# Patient Record
Sex: Female | Born: 1980 | Hispanic: No | Marital: Single | Smoking: Never smoker
Health system: Southern US, Community
[De-identification: ages and names within clinical notes are randomized; demographics above are authoritative.]

## PROBLEM LIST (undated history)

## (undated) DIAGNOSIS — Z8041 Family history of malignant neoplasm of ovary: Secondary | ICD-10-CM

## (undated) DIAGNOSIS — Z808 Family history of malignant neoplasm of other organs or systems: Secondary | ICD-10-CM

## (undated) HISTORY — DX: Family history of malignant neoplasm of ovary: Z80.41

## (undated) HISTORY — DX: Family history of malignant neoplasm of other organs or systems: Z80.8

---

## 2014-10-14 ENCOUNTER — Other Ambulatory Visit: Payer: Self-pay | Admitting: Internal Medicine

## 2014-10-14 DIAGNOSIS — D1803 Hemangioma of intra-abdominal structures: Secondary | ICD-10-CM

## 2014-10-27 ENCOUNTER — Ambulatory Visit
Admission: RE | Admit: 2014-10-27 | Discharge: 2014-10-27 | Disposition: A | Discharge status: Home / Self Care | Payer: Self-pay | Source: Ambulatory Visit | Attending: Internal Medicine | Admitting: Internal Medicine

## 2014-10-27 ENCOUNTER — Encounter (INDEPENDENT_AMBULATORY_CARE_PROVIDER_SITE_OTHER): Payer: Self-pay

## 2014-10-27 DIAGNOSIS — D1803 Hemangioma of intra-abdominal structures: Secondary | ICD-10-CM

## 2015-09-22 DIAGNOSIS — H5212 Myopia, left eye: Secondary | ICD-10-CM | POA: Diagnosis not present

## 2015-11-03 DIAGNOSIS — Z1389 Encounter for screening for other disorder: Secondary | ICD-10-CM | POA: Diagnosis not present

## 2015-11-03 DIAGNOSIS — D1803 Hemangioma of intra-abdominal structures: Secondary | ICD-10-CM | POA: Diagnosis not present

## 2015-11-03 DIAGNOSIS — Z0000 Encounter for general adult medical examination without abnormal findings: Secondary | ICD-10-CM | POA: Diagnosis not present

## 2015-11-04 ENCOUNTER — Other Ambulatory Visit: Payer: Self-pay | Admitting: Internal Medicine

## 2015-11-04 DIAGNOSIS — D1803 Hemangioma of intra-abdominal structures: Secondary | ICD-10-CM

## 2015-11-10 DIAGNOSIS — Z0000 Encounter for general adult medical examination without abnormal findings: Secondary | ICD-10-CM | POA: Diagnosis not present

## 2015-11-12 DIAGNOSIS — H109 Unspecified conjunctivitis: Secondary | ICD-10-CM | POA: Diagnosis not present

## 2015-11-23 DIAGNOSIS — H1045 Other chronic allergic conjunctivitis: Secondary | ICD-10-CM | POA: Diagnosis not present

## 2016-04-19 DIAGNOSIS — Z01419 Encounter for gynecological examination (general) (routine) without abnormal findings: Secondary | ICD-10-CM | POA: Diagnosis not present

## 2016-04-27 ENCOUNTER — Ambulatory Visit (INDEPENDENT_AMBULATORY_CARE_PROVIDER_SITE_OTHER): Payer: BLUE CROSS/BLUE SHIELD | Admitting: Internal Medicine

## 2016-04-27 ENCOUNTER — Encounter: Payer: Self-pay | Admitting: Internal Medicine

## 2016-04-27 DIAGNOSIS — Z579 Occupational exposure to unspecified risk factor: Secondary | ICD-10-CM

## 2016-04-27 DIAGNOSIS — Z23 Encounter for immunization: Secondary | ICD-10-CM | POA: Diagnosis not present

## 2016-04-27 NOTE — Progress Notes (Signed)
    RFV: low rabies titer  Patient ID: Desiree Duncan, female   DOB: [birth year], 35 y.o.   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 She reports that she has received 5 dose post exposure rabies vaccine series in 2013. She tolerated the vaccine series without difficulty. She recently had her rabies titers checked and they were low. She has been looking for a provider to be able to give her a rabies vaccine  She is in good health. No recent illnesses or hospitalization, she is not currently pregnant  All: NKMA  Meds: mvi  Past medical history: none, history mild appendicitis  Past surgical history: none  Family history: Maternal GF - bone cancer, M and F have HTN,  Social hx: no smoking, no drinking.  There are no active problems to display for this patient.  Health Maintenance Due  Topic Date Due  . HIV Screening  11/18/1993  . TETANUS/TDAP  11/18/1997  . PAP SMEAR  11/19/1999  . INFLUENZA VACCINE  12/13/2015    Physical exam:  Gen: axo by 3 in NAD HEENT = [place]/AT, PERRLA, EOMI, Skin = no signs of rash Neuro = CN2-12 intact, motor intact  Assessment and Plan  Low rabies titer, need for rabies vaccine due to occupation = per CDC/ACIP recommendation is that the patient receives 1 dose as a booster of rabies vaccine. I will recommend that she can come back in 4 wk to check post vaccine titer, then should repeat every 2 years to determine if she needs further boosters.

## 2016-04-27 NOTE — Patient Instructions (Addendum)
Please come back in 4 wk to get repeat blood work to check your rabies titer.  [id number]

## 2016-05-24 ENCOUNTER — Other Ambulatory Visit: Payer: BLUE CROSS/BLUE SHIELD

## 2016-05-31 ENCOUNTER — Other Ambulatory Visit: Payer: BLUE CROSS/BLUE SHIELD

## 2016-06-22 DIAGNOSIS — B349 Viral infection, unspecified: Secondary | ICD-10-CM | POA: Diagnosis not present

## 2016-07-05 ENCOUNTER — Other Ambulatory Visit (HOSPITAL_COMMUNITY)
Admission: RE | Admit: 2016-07-05 | Discharge: 2016-07-05 | Disposition: A | Discharge status: Home / Self Care | Payer: BLUE CROSS/BLUE SHIELD | Source: Ambulatory Visit | Attending: Obstetrics and Gynecology | Admitting: Obstetrics and Gynecology

## 2016-07-05 DIAGNOSIS — Z01419 Encounter for gynecological examination (general) (routine) without abnormal findings: Secondary | ICD-10-CM | POA: Diagnosis not present

## 2016-07-05 DIAGNOSIS — Z1151 Encounter for screening for human papillomavirus (HPV): Secondary | ICD-10-CM | POA: Insufficient documentation

## 2016-07-05 DIAGNOSIS — Z124 Encounter for screening for malignant neoplasm of cervix: Secondary | ICD-10-CM | POA: Diagnosis not present

## 2016-08-09 DIAGNOSIS — D259 Leiomyoma of uterus, unspecified: Secondary | ICD-10-CM | POA: Diagnosis not present

## 2016-11-08 DIAGNOSIS — D1803 Hemangioma of intra-abdominal structures: Secondary | ICD-10-CM | POA: Diagnosis not present

## 2016-11-08 DIAGNOSIS — Z0000 Encounter for general adult medical examination without abnormal findings: Secondary | ICD-10-CM | POA: Diagnosis not present

## 2017-03-12 DIAGNOSIS — D251 Intramural leiomyoma of uterus: Secondary | ICD-10-CM | POA: Diagnosis not present

## 2017-04-11 DIAGNOSIS — F4323 Adjustment disorder with mixed anxiety and depressed mood: Secondary | ICD-10-CM | POA: Diagnosis not present

## 2017-07-05 DIAGNOSIS — Z23 Encounter for immunization: Secondary | ICD-10-CM | POA: Diagnosis not present

## 2017-08-05 DIAGNOSIS — N200 Calculus of kidney: Secondary | ICD-10-CM | POA: Diagnosis not present

## 2017-08-27 DIAGNOSIS — Z01419 Encounter for gynecological examination (general) (routine) without abnormal findings: Secondary | ICD-10-CM | POA: Diagnosis not present

## 2017-09-05 DIAGNOSIS — N200 Calculus of kidney: Secondary | ICD-10-CM | POA: Diagnosis not present

## 2017-11-12 DIAGNOSIS — D1803 Hemangioma of intra-abdominal structures: Secondary | ICD-10-CM | POA: Diagnosis not present

## 2017-11-12 DIAGNOSIS — N200 Calculus of kidney: Secondary | ICD-10-CM | POA: Diagnosis not present

## 2017-11-12 DIAGNOSIS — Z0000 Encounter for general adult medical examination without abnormal findings: Secondary | ICD-10-CM | POA: Diagnosis not present

## 2017-11-12 DIAGNOSIS — Z1389 Encounter for screening for other disorder: Secondary | ICD-10-CM | POA: Diagnosis not present

## 2017-12-03 DIAGNOSIS — D219 Benign neoplasm of connective and other soft tissue, unspecified: Secondary | ICD-10-CM | POA: Diagnosis not present

## 2017-12-03 DIAGNOSIS — Z8041 Family history of malignant neoplasm of ovary: Secondary | ICD-10-CM | POA: Diagnosis not present

## 2017-12-24 ENCOUNTER — Encounter: Payer: Self-pay | Admitting: Genetics

## 2017-12-24 ENCOUNTER — Telehealth: Payer: Self-pay | Admitting: Genetics

## 2017-12-24 NOTE — Telephone Encounter (Signed)
New genetic counseling referral received from Dr. Christophe Louis from [facility] for fhx of ovarian cancer in first degree relative. Pt has been scheduled to see Ferol Luz on 9/10 at 11am. Letter mailed to the pt.

## 2018-01-02 DIAGNOSIS — R600 Localized edema: Secondary | ICD-10-CM | POA: Diagnosis not present

## 2018-01-02 DIAGNOSIS — R0683 Snoring: Secondary | ICD-10-CM | POA: Diagnosis not present

## 2018-01-21 ENCOUNTER — Inpatient Hospital Stay: Payer: BLUE CROSS/BLUE SHIELD

## 2018-01-21 ENCOUNTER — Telehealth: Payer: Self-pay | Admitting: Genetics

## 2018-01-21 ENCOUNTER — Inpatient Hospital Stay: Payer: BLUE CROSS/BLUE SHIELD | Attending: Genetic Counselor | Admitting: Genetics

## 2018-01-21 ENCOUNTER — Encounter: Payer: Self-pay | Admitting: Genetics

## 2018-01-21 NOTE — Telephone Encounter (Signed)
Left messate- told patient we were calling to touch base about missed appointment for genetic counseling on 01/21/2018.  Left my number and New patient referral/scheduling number if she would like to reschedule.

## 2018-02-13 ENCOUNTER — Inpatient Hospital Stay: Payer: BLUE CROSS/BLUE SHIELD | Admitting: Licensed Clinical Social Worker

## 2018-02-13 ENCOUNTER — Inpatient Hospital Stay: Payer: BLUE CROSS/BLUE SHIELD

## 2018-02-25 ENCOUNTER — Other Ambulatory Visit: Payer: Self-pay | Admitting: Geriatric Medicine

## 2018-02-25 DIAGNOSIS — R1084 Generalized abdominal pain: Secondary | ICD-10-CM

## 2018-02-25 DIAGNOSIS — R1031 Right lower quadrant pain: Secondary | ICD-10-CM | POA: Diagnosis not present

## 2018-03-10 ENCOUNTER — Other Ambulatory Visit: Payer: BLUE CROSS/BLUE SHIELD

## 2018-03-10 ENCOUNTER — Encounter: Payer: Self-pay | Admitting: Licensed Clinical Social Worker

## 2018-03-10 ENCOUNTER — Inpatient Hospital Stay: Payer: BLUE CROSS/BLUE SHIELD

## 2018-03-10 ENCOUNTER — Inpatient Hospital Stay: Payer: BLUE CROSS/BLUE SHIELD | Attending: Genetic Counselor | Admitting: Licensed Clinical Social Worker

## 2018-03-10 DIAGNOSIS — Z808 Family history of malignant neoplasm of other organs or systems: Secondary | ICD-10-CM

## 2018-03-10 DIAGNOSIS — Z8041 Family history of malignant neoplasm of ovary: Secondary | ICD-10-CM

## 2018-03-10 NOTE — Progress Notes (Signed)
REFERRING PROVIDER: Christophe Louis, MD [place] [address] [employer] [address] [place], [place] [address]  PRIMARY PROVIDER:  Wenda Low, MD  PRIMARY REASON FOR VISIT:  1. Family history of ovarian cancer   2. Family history of bone cancer      HISTORY OF PRESENT ILLNESS:   Desiree Duncan, a 37 y.o. female, was seen for a [facility] cancer genetics consultation at the request of Dr. Landry Mellow due to a family history of ovarian cancer.  Desiree Duncan presents to clinic today to discuss the possibility of a hereditary predisposition to cancer, genetic testing, and to further clarify her future cancer risks, as well as potential cancer risks for family members.    Desiree Duncan is a 37 y.o. female with no personal history of cancer.    HORMONAL RISK FACTORS:  Menarche was at age 54.  First live birth at age: no children.  OCP use for approximately 0 years.  Ovaries intact: yes.  Hysterectomy: no.  Menopausal status: premenopausal.  HRT use: 0 years. Colonoscopy: no; not examined. Mammogram within the last year: yes. Number of breast biopsies: 0.  Past Medical History:  Diagnosis Date  . Family history of bone cancer   . Family history of ovarian cancer     Social History   Socioeconomic History  . Marital status: Single    Spouse name: Not on file  . Number of children: Not on file  . Years of education: Not on file  . Highest education level: Not on file  Occupational History  . Not on file  Social Needs  . Financial resource strain: Not on file  . Food insecurity:    Worry: Not on file    Inability: Not on file  . Transportation needs:    Medical: Not on file    Non-medical: Not on file  Tobacco Use  . Smoking status: Never Smoker  . Smokeless tobacco: Never Used  Substance and Sexual Activity  . Alcohol use: No  . Drug use: No  . Sexual activity: Not on file  Lifestyle  . Physical activity:    Days per week: Not on file    Minutes per session: Not on file  . Stress:  Not on file  Relationships  . Social connections:    Talks on phone: Not on file    Gets together: Not on file    Attends religious service: Not on file    Active member of club or organization: Not on file    Attends meetings of clubs or organizations: Not on file    Relationship status: Not on file  Other Topics Concern  . Not on file  Social History Narrative  . Not on file     FAMILY HISTORY:  We obtained a detailed, 4-generation family history.  Significant diagnoses are listed below: Family History  Problem Relation Age of Onset  . Ovarian cancer Sister 62       Negative genetic testing, [facility] Common Hereditary Cancers Panel  . Bone cancer Maternal Grandfather 49    Desiree Duncan does not have children. She has one sister, 17, who was recently diagnosed with ovarian cancer. She reports that she had negative genetic testing through [facility] and showed me a picture of the test report on her phone--she tested negative on the Common Hereditary Cancers Panel. Desiree Duncan has one brother as well, he is 80 with no cancer history.   Desiree Duncan's father is living at 8, no cancers. He has two brothers  and two sisters. One of the patient's paternal uncles died in an accident, the rest of her aunts and uncles are living and doing well as are her paternal cousins. Her paternal grandmother is 21 and grandfather is 14.  Desiree Duncan's mother is living at 18. She has one brother and sister, they are doing well as are Desiree Duncan's maternal cousins. The patient's maternal grandfather had bone cancer diagnosed at 52 and he died at 4. Her maternal grandmother is living at 80.   Desiree Duncan is aware of previous family history of genetic testing for hereditary cancer risks. Patient's maternal ancestors are of [place] descent, and paternal ancestors are of [place] descent. There is no reported Ashkenazi Jewish ancestry. There is no known consanguinity.  GENETIC COUNSELING ASSESSMENT:  Desiree Duncan is a 37 y.o. female with a family history which is somewhat suggestive of a Hereditary Cancer Predisposition Syndrome. We, therefore, discussed and recommended the following at today's visit.   DISCUSSION: We discussed that about 5-10% ofcancer cases are hereditary. We discussed the BRCA1 and BRCA2 genes in particular, noting that there are many other genes we can look at that are associated with many types of cancers. We reviewed the characteristics, features and inheritance patterns of hereditary cancer syndromes. We also discussed genetic testing, including the appropriate family members to test, the process of testing, insurance coverage and turn-around-time for results. We discussed the implications of a negative, positive and/or variant of uncertain significant result. We discussed that Desiree Duncan sister was the most appropriate person to test and that, given her negative testing, it is very unlikely that we would find something in Desiree Duncan. While her sister's negative genetic testing is very reassuring, Desiree Duncan still wanted to pursue genetic testing for her own peace of mind. We recommended Desiree Duncan pursue genetic testing for [facility]'s Multi-Cancer Panel.  The Multi-Cancer Panel offered by [facility] includes sequencing and/or deletion duplication testing of the following 84 genes: AIP, ALK, APC, ATM, AXIN2,BAP1,  BARD1, BLM, BMPR1A, BRCA1, BRCA2, BRIP1, CASR, CDC73, CDH1, CDK4, CDKN1B, CDKN1C, CDKN2A (p14ARF), CDKN2A (p16INK4a), CEBPA, CHEK2, CTNNA1, DICER1, DIS3L2, EGFR (c.2369C>T, p.Thr790Met variant only), EPCAM (Deletion/duplication testing only), FH, FLCN, GATA2, GPC3, GREM1 (Promoter region deletion/duplication testing only), HOXB13 (c.251G>A, p.Gly84Glu), HRAS, KIT, MAX, MEN1, MET, MITF (c.952G>A, p.Glu318Lys variant only), MLH1, MSH2, MSH3, MSH6, MUTYH, NBN, NF1, NF2, NTHL1, PALB2, PDGFRA, PHOX2B, PMS2, POLD1, POLE, POT1, PRKAR1A, PTCH1, PTEN, RAD50, RAD51C,  RAD51D, RB1, RECQL4, RET, RUNX1, SDHAF2, SDHA (sequence changes only), SDHB, SDHC, SDHD, SMAD4, SMARCA4, SMARCB1, SMARCE1, STK11, SUFU, TERC, TERT, TMEM127, TP53, TSC1, TSC2, VHL, WRN and WT1.   We discussed that if she is found to have a mutation in one of these genes, it may impact future medical management recommendations such as increased cancer screenings and consideration of risk reducing surgeries.  A positive result could also have implications for the patient's family members.  A Negative result would mean we were unable to identify a hereditary component to her family history of cancer but does not rule out the possibility of a hereditary basis for her family history of cancer.  There could be mutations that are undetectable by current technology, or in genes not yet tested or identified to increase cancer risk.    We discussed the potential to find a Variant of Uncertain Significance or VUS.  These are variants that have not yet been identified as pathogenic or benign, and it is unknown if this variant is associated with increased cancer risk or if this  is a normal finding.  Most VUS's are reclassified to benign or likely benign.   It should not be used to make medical management decisions. With time, we suspect the lab will determine the significance of any VUS's identified if any.   Based on Desiree Duncan's family history of cancer, she meets NCCN medical criteria for genetic testing. Despite that she meets criteria, she may still have an out of pocket cost. The lab will provider her with an out of pocket cost as well as a patient pay option.   PLAN: After considering the risks, benefits, and limitations, Desiree Duncan  provided informed consent to pursue genetic testing and the blood sample was sent to [facility] for analysis of the Multi-Cancer Panel. Results should be available within approximately 2-3 weeks' time, at which point they will be disclosed by telephone to Ms.  Duncan, as will any additional recommendations warranted by these results. Desiree Duncan will receive a summary of her genetic counseling visit and a copy of her results once available. This information will also be available in Epic.  Lastly, we encouraged Ms. Lehane to remain in contact with cancer genetics annually so that we can continuously update the family history and inform her of any changes in cancer genetics and testing that may be of benefit for this family.   Ms.  Ocampo's questions were answered to her satisfaction today. Our contact information was provided should additional questions or concerns arise. Thank you for the referral and allowing [place] to share in the care of your patient.   Faith Rogue, MS Genetic Counselor [email or web address] Phone: [phone]  The patient was seen for a total of 25 minutes in face-to-face genetic counseling.

## 2018-03-20 ENCOUNTER — Ambulatory Visit
Admission: RE | Admit: 2018-03-20 | Discharge: 2018-03-20 | Disposition: A | Discharge status: Home / Self Care | Payer: BLUE CROSS/BLUE SHIELD | Source: Ambulatory Visit | Attending: Geriatric Medicine | Admitting: Geriatric Medicine

## 2018-03-20 DIAGNOSIS — R1084 Generalized abdominal pain: Secondary | ICD-10-CM

## 2018-03-20 DIAGNOSIS — R509 Fever, unspecified: Secondary | ICD-10-CM | POA: Diagnosis not present

## 2018-03-20 DIAGNOSIS — N200 Calculus of kidney: Secondary | ICD-10-CM | POA: Diagnosis not present

## 2018-03-20 IMAGING — CT CT ABD-PELV W/ CM
2 of 4 series · 12 of 46 positions shown, 14 images · IV contrast (iopamidol)
Comparison: None.

CLINICAL DATA: Lower abdominal pain.

EXAM:
CT ABDOMEN AND PELVIS WITH CONTRAST
TECHNIQUE: Multidetector CT imaging of the abdomen and pelvis was performed
using the standard protocol following bolus administration of
intravenous contrast.
CONTRAST:  100mL [OG] IOPAMIDOL ([OG]) INJECTION 61%

[Series 2: abd pelvis 5.00 br40 s3 ax · axial · 0.58mm/px · z∈[+1210,+1590]mm · 9 of 90 slices shown, 11 images]
[im 7/90  soft-tissue]
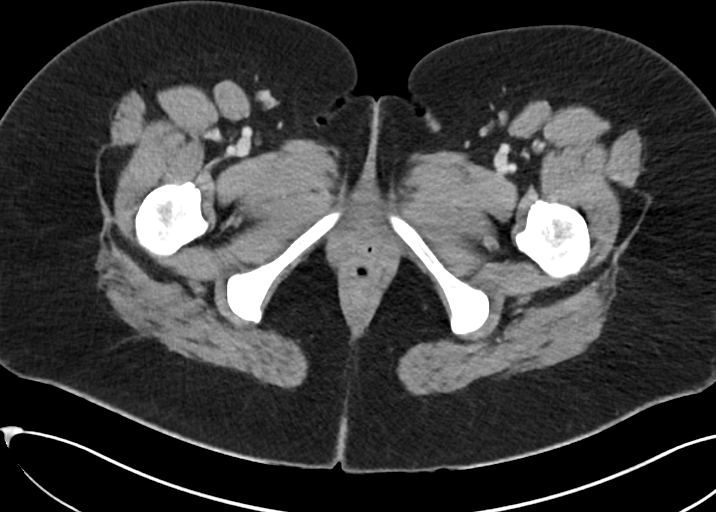
[im 7/90  bone]
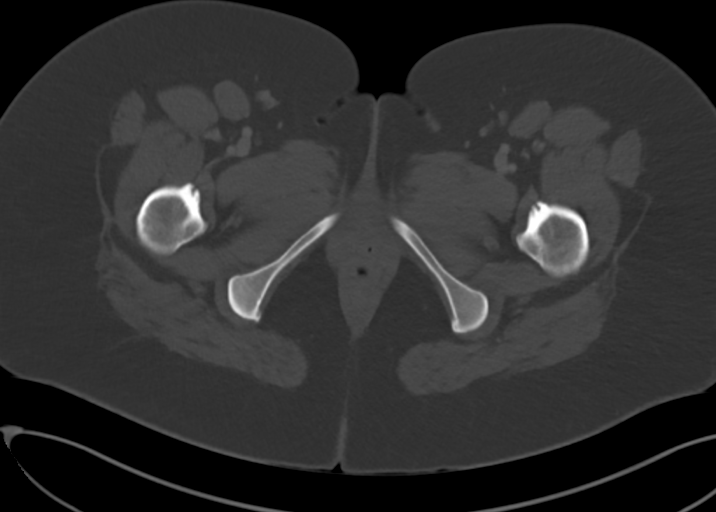
[im 17/90  soft-tissue]
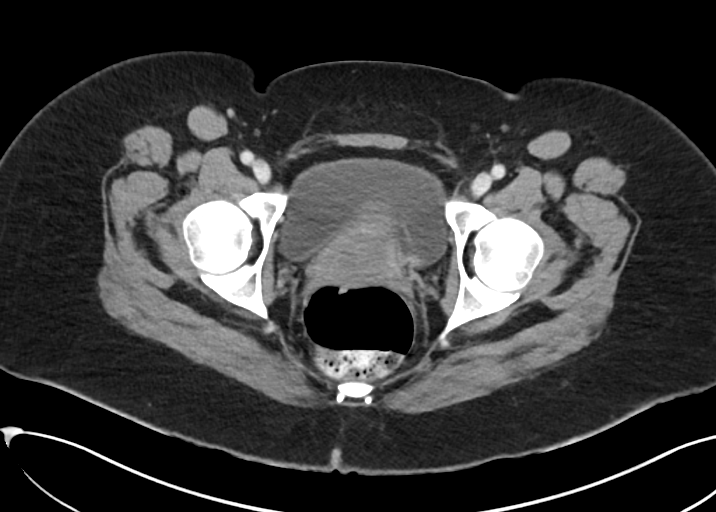
[im 27/90  soft-tissue]
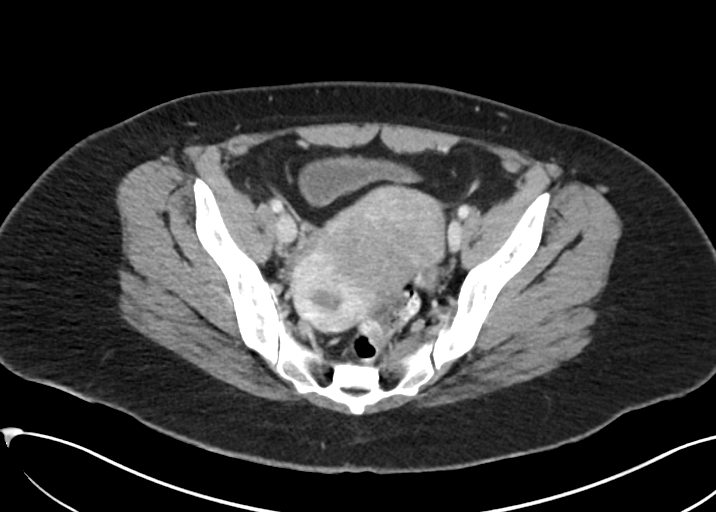
[im 37/90  soft-tissue]
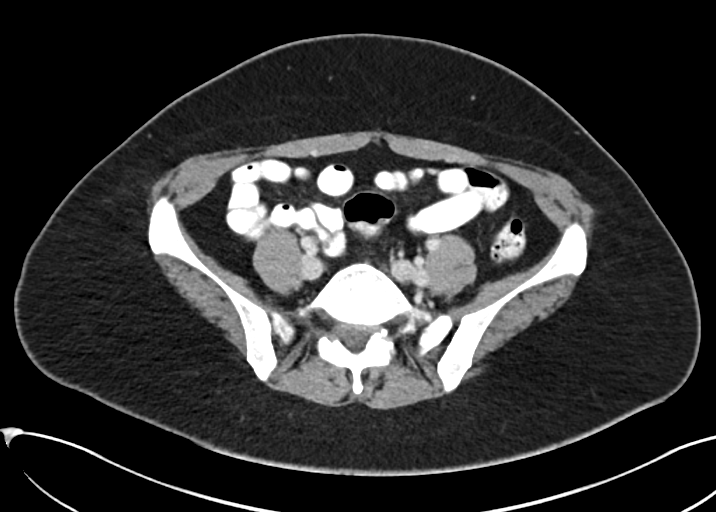
[im 47/90  soft-tissue]
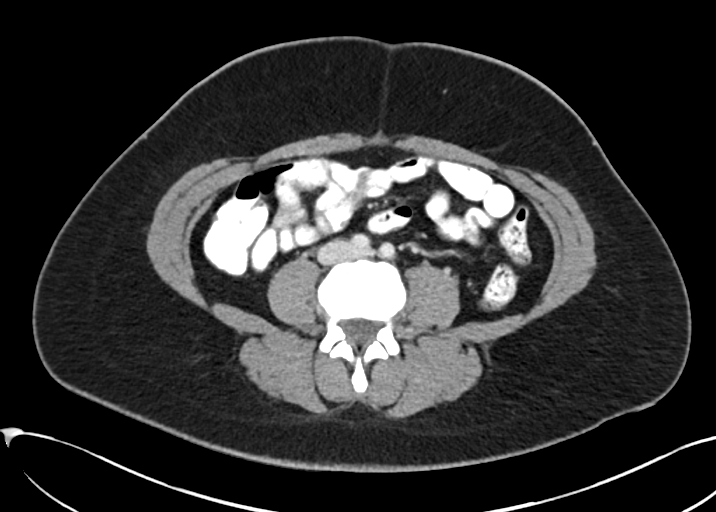
[im 53/90  soft-tissue]
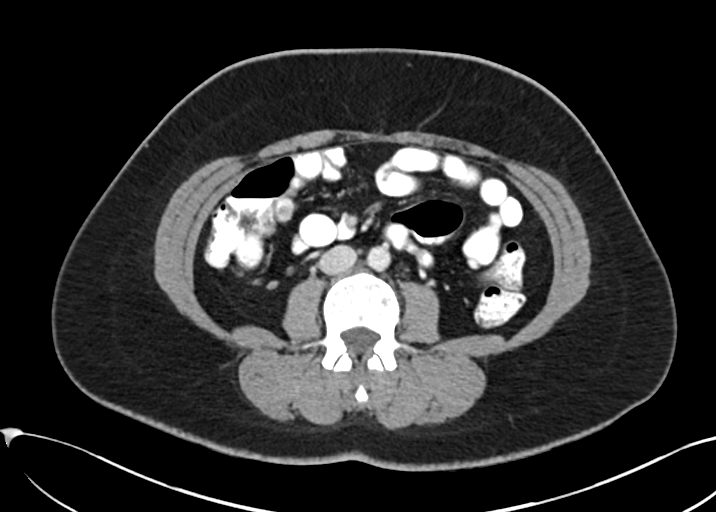
[im 63/90  soft-tissue]
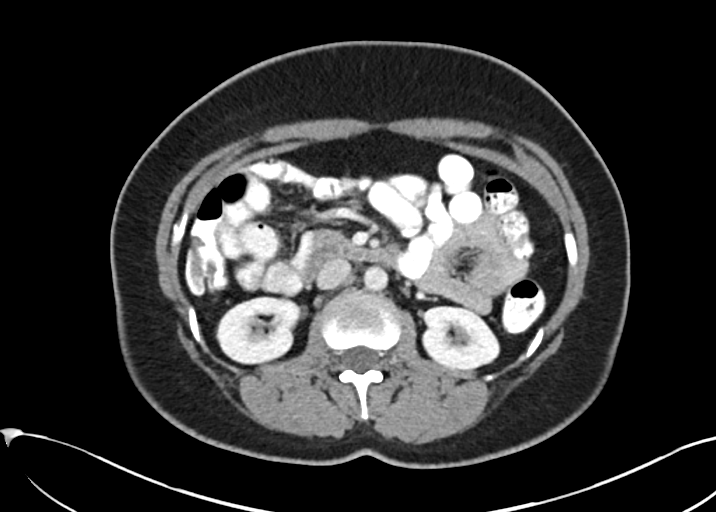
[im 73/90  soft-tissue]
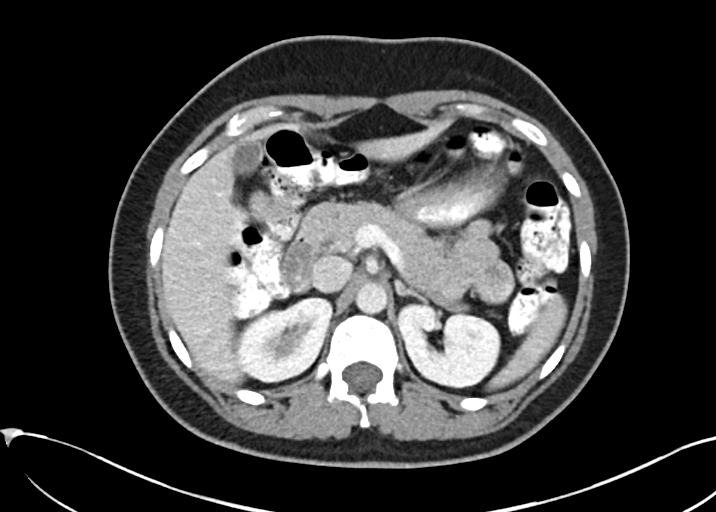
[im 83/90  soft-tissue]
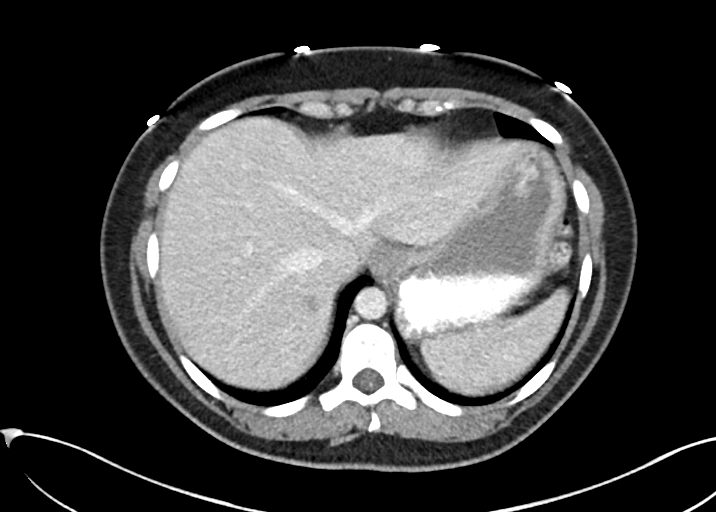
[im 83/90  bone]
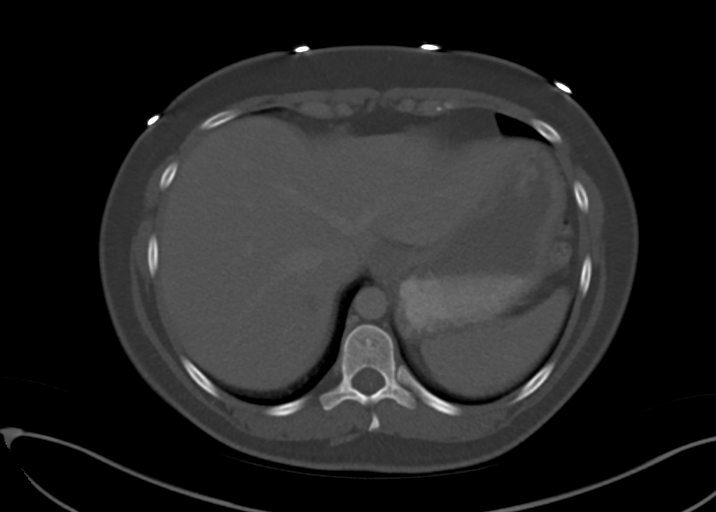

[Series 6: abd pelvis 2.00 br40 s3 cor · coronal · 0.81mm/px · 3 of 147 slices shown]
[im 49/147  soft-tissue]
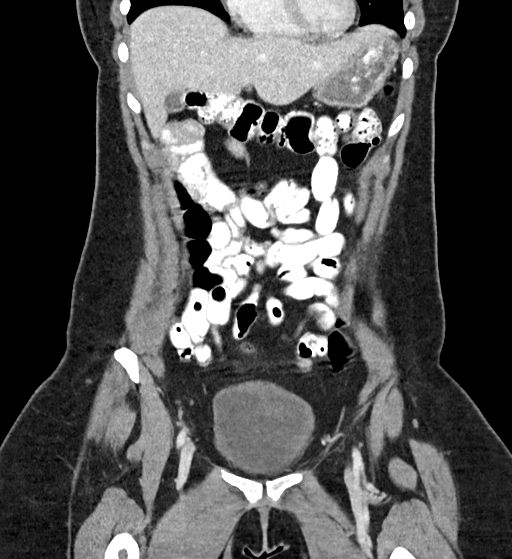
[im 65/147  soft-tissue]
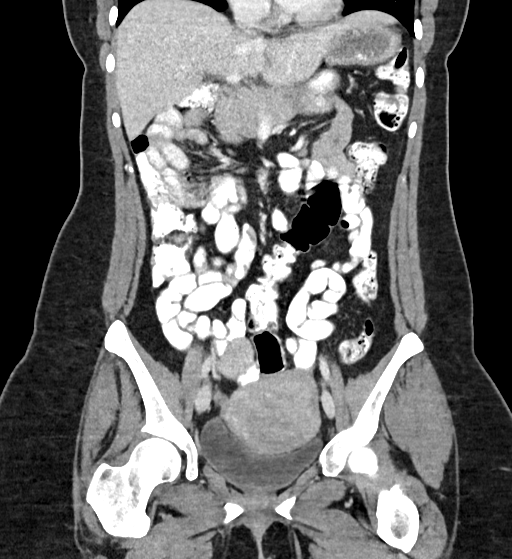
[im 82/147  soft-tissue]
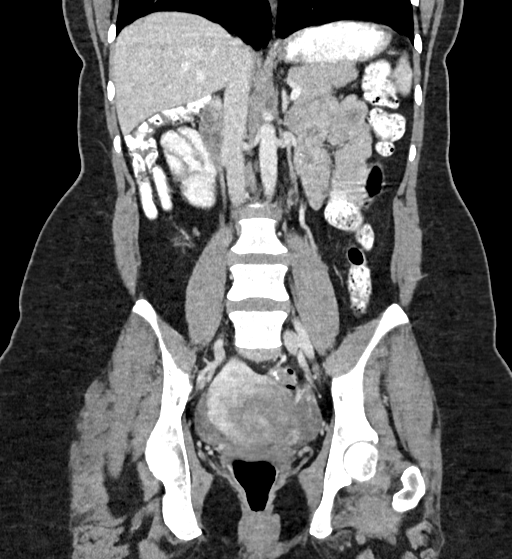

[12 of 46 positions shown; findings below may reference images not displayed]

FINDINGS: Lower chest: No acute abnormality.

Hepatobiliary: There is a 12 mm hypoechoic mass in the right hepatic
lobe on series 2, image 8. This may correlate with a history of a
hemangioma. No other liver masses. Portal vein and gallbladder are
normal.

Pancreas: The pancreas is normal.

Spleen: Normal in size without focal abnormality.

Adrenals/Urinary Tract: Tiny nonobstructive stones in the lower left
kidney on axial image 26. No hydronephrosis or perinephric
stranding. No ureteral stones. The bladder is normal. The adrenal
glands are normal.

Stomach/Bowel: The stomach and small bowel are normal. The colon and
appendix are normal.

Vascular/Lymphatic: No significant vascular findings are present. No
enlarged abdominal or pelvic lymph nodes.

Reproductive: There is a large mass associated with the left-sided
uterus measuring 6.6 by 7.4 by 6.9 cm. A small right-sided mass
measures 3.6 cm. The right ovary is normal. Ring enhancement in the
left ovary is likely a corpus luteum cyst.

Other: No abdominal wall hernia or abnormality. No abdominopelvic
ascites.

Musculoskeletal: No acute or significant osseous findings.
IMPRESSION: 1. Two masses in the uterus. The largest is dominant measuring up to
7.4 cm. These are likely fibroids. Recommend correlation with
ultrasound.
2. The 12 mm hypoechoic mass in the right hepatic lobe is
nonspecific on this study. However, the patient has a history of a
hemangioma and no other masses are seen today. This likely
represents the patient's known hemangioma.
3. Nonobstructive left renal stones.
4. Corpus luteum cyst in the left ovary.

## 2018-03-20 MED ORDER — IOPAMIDOL (ISOVUE-300) INJECTION 61%
100.0000 mL | Freq: Once | INTRAVENOUS | Status: AC | PRN
  Administered 2018-03-20: 100 mL via INTRAVENOUS

## 2018-03-25 ENCOUNTER — Telehealth: Payer: Self-pay | Admitting: Licensed Clinical Social Worker

## 2018-03-25 ENCOUNTER — Ambulatory Visit: Payer: Self-pay | Admitting: Licensed Clinical Social Worker

## 2018-03-25 ENCOUNTER — Encounter: Payer: Self-pay | Admitting: Licensed Clinical Social Worker

## 2018-03-25 DIAGNOSIS — Z1379 Encounter for other screening for genetic and chromosomal anomalies: Secondary | ICD-10-CM

## 2018-03-25 NOTE — Telephone Encounter (Signed)
Revealed negative genetic testing.   This normal result is reassuring. It is unlikely that there is an increased risk of another cancer due to a mutation in one of these genes.  However, genetic testing is not perfect, and cannot definitively rule out a hereditary cause.  It will be important for her to keep in contact with genetics to learn if any additional testing may be needed in the future. Additionally we discussed she still has an increased risk for ovarian cancer given her sister's diagnosis. We discussed continuing to follow doctor's recommendations for cancer screenings.

## 2018-03-25 NOTE — Progress Notes (Signed)
HPI:  Ms. Difrancesco was previously seen in the [facility] clinic on 03/10/2018 due to a family history of ovarian cancer and concerns regarding a hereditary predisposition to cancer. Please refer to our prior cancer genetics clinic note for more information regarding Ms. Shaver's medical, social and family histories, and our assessment and recommendations, at the time. Ms. Escandon's recent genetic test results were disclosed to her, as well as recommendations warranted by these results. These results and recommendations are discussed in more detail below.  CANCER HISTORY:   No history exists.     FAMILY HISTORY:  We obtained a detailed, 4-generation family history.  Significant diagnoses are listed below: Family History  Problem Relation Age of Onset  . Ovarian cancer Sister 44       Negative genetic testing, [facility] Common Hereditary Cancers Panel  . Bone cancer Maternal Grandfather 42   Ms. Sweney does not have children. She has one sister, 27, who was recently diagnosed with ovarian cancer. She reports that she had negative genetic testing through [facility] and showed me a picture of the test report on her phone--she tested negative on the Common Hereditary Cancers Panel. Ms. Fikes has one brother as well, he is 71 with no cancer history.   Ms. Pacey's father is living at 27, no cancers. He has two brothers and two sisters. One of the patient's paternal uncles died in an accident, the rest of her aunts and uncles are living and doing well as are her paternal cousins. Her paternal grandmother is 3 and grandfather is 73.  Ms. Keener's mother is living at 8. She has one brother and sister, they are doing well as are Ms. Barkdull's maternal cousins. The patient's maternal grandfather had bone cancer diagnosed at 78 and he died at 55. Her maternal grandmother is living at 75.   Ms. Lastra is aware of previous family history of genetic testing for  hereditary cancer risks. Patient's maternal ancestors are of [place] descent, and paternal ancestors are of [place] descent. There is no reported Ashkenazi Jewish ancestry. There is no known consanguinity.  GENETIC TEST RESULTS: Genetic testing performed through [facility]'s Multi-Cancer Panel reported out on 03/21/2018 showed no pathogenic mutations. The Multi-Cancer Panel offered by [facility] includes sequencing and/or deletion duplication testing of the following 84 genes: AIP, ALK, APC, ATM, AXIN2,BAP1,  BARD1, BLM, BMPR1A, BRCA1, BRCA2, BRIP1, CASR, CDC73, CDH1, CDK4, CDKN1B, CDKN1C, CDKN2A (p14ARF), CDKN2A (p16INK4a), CEBPA, CHEK2, CTNNA1, DICER1, DIS3L2, EGFR (c.2369C>T, p.Thr790Met variant only), EPCAM (Deletion/duplication testing only), FH, FLCN, GATA2, GPC3, GREM1 (Promoter region deletion/duplication testing only), HOXB13 (c.251G>A, p.Gly84Glu), HRAS, KIT, MAX, MEN1, MET, MITF (c.952G>A, p.Glu318Lys variant only), MLH1, MSH2, MSH3, MSH6, MUTYH, NBN, NF1, NF2, NTHL1, PALB2, PDGFRA, PHOX2B, PMS2, POLD1, POLE, POT1, PRKAR1A, PTCH1, PTEN, RAD50, RAD51C, RAD51D, RB1, RECQL4, RET, RUNX1, SDHAF2, SDHA (sequence changes only), SDHB, SDHC, SDHD, SMAD4, SMARCA4, SMARCB1, SMARCE1, STK11, SUFU, TERC, TERT, TMEM127, TP53, TSC1, TSC2, VHL, WRN and WT1. .  The test report will be scanned into EPIC and will be located under the Molecular Pathology section of the Results Review tab. A portion of the result report is included below for reference.     We discussed with Ms. Falcon that because current genetic testing is not perfect, it is possible there may be a gene mutation in one of these genes that current testing cannot detect, but that chance is small.  We also discussed, that there could be another gene that has not yet been discovered, or that we have  not yet tested, that is responsible for the cancer diagnoses in the family. It is also possible there is a hereditary cause for the cancer in the family that Ms.  Demetriou did not inherit and therefore was not identified in her testing.  Therefore, it is important to remain in touch with cancer genetics in the future so that we can continue to offer Ms. Morten the most up to date genetic testing.   ADDITIONAL GENETIC TESTING: We discussed with Ms. Winders that her genetic testing was fairly extensive.  If there are are genes identified to increase cancer risk that can be analyzed in the future, we would be happy to discuss and coordinate this testing at that time.    CANCER SCREENING RECOMMENDATIONS: Ms. Weathers's test result is considered negative (normal).  This means that we have not identified a hereditary cause for her family history of cancer at this time.   This normal indicates that it is unlikely Ms. Duquette has an increased risk of cancer due to a mutation in one of these genes.  Therefore, Ms. Pattison was advised to continue following the cancer screening guidelines provided by her primary healthcare providers. Other factors such as her personal and family history may still affect her cancer risk.  Given her sister's ovarian cancer, Ms. Madia does have an increased risk of ovarian cancer. The lifetime risk for individuals with a first degree relative with ovarian cancer is estimated to be about 5%.   RECOMMENDATIONS FOR FAMILY MEMBERS:  Relatives in this family might be at some increased risk of developing cancer, over the general population risk, simply due to the family history of cancer.  We recommended women in this family have a yearly mammogram beginning at age 70, or 79 years younger than the earliest onset of cancer, an annual clinical breast exam, and perform monthly breast self-exams. Women in this family should also have a gynecological exam as recommended by their primary provider. All family members should have a colonoscopy by age 26 (or as directed by their doctors).  All family members should inform their physicians  about the family history of cancer so their doctors can make the most appropriate screening recommendations for them.   FOLLOW-UP: Lastly, we discussed with Ms. Damiani that cancer genetics is a rapidly advancing field and it is possible that new genetic tests will be appropriate for her and/or her family members in the future. We encouraged her to remain in contact with cancer genetics on an annual basis so we can update her personal and family histories and let her know of advances in cancer genetics that may benefit this family.   Our contact number was provided. Ms. Lehrman's questions were answered to her satisfaction, and she knows she is welcome to call us at anytime with additional questions or concerns.  Faith Rogue, MS Genetic Counselor [email or web address] Phone: [phone]

## 2018-03-27 ENCOUNTER — Institutional Professional Consult (permissible substitution): Payer: BLUE CROSS/BLUE SHIELD | Admitting: Pulmonary Disease

## 2018-05-01 ENCOUNTER — Ambulatory Visit (INDEPENDENT_AMBULATORY_CARE_PROVIDER_SITE_OTHER): Payer: BLUE CROSS/BLUE SHIELD | Admitting: Pulmonary Disease

## 2018-05-01 ENCOUNTER — Encounter: Payer: Self-pay | Admitting: Pulmonary Disease

## 2018-05-01 DIAGNOSIS — R0683 Snoring: Secondary | ICD-10-CM

## 2018-05-01 DIAGNOSIS — G4733 Obstructive sleep apnea (adult) (pediatric): Secondary | ICD-10-CM | POA: Insufficient documentation

## 2018-05-01 NOTE — Assessment & Plan Note (Signed)
Given narrow pharyngeal exam & loud snoring, obstructive sleep apnea is possible & an overnight polysomnogram will be scheduled as a home study. The pathophysiology of obstructive sleep apnea , it's cardiovascular consequences & modes of treatment including CPAP were discused with the patient in detail & they evidenced understanding.  Pretest probability is intermediate.  This could be simple snoring related to her deviated septum and narrow pharyngeal airway or could be mild OSA.  We discussed treatment options including oral appliance and EZPAP for snoring. She will try over-the-counter nasal steroid such as Nasonex in the meantime

## 2018-05-01 NOTE — Patient Instructions (Signed)
Schedule home sleep study. We discussed various treatment options

## 2018-05-01 NOTE — Progress Notes (Signed)
Subjective:    Patient ID: Desiree Duncan, female    DOB: 03-Jan-1981, 37 y.o.   MRN: [id number]  HPI  Chief Complaint  Patient presents with  . Sleep Consult    Referred by Dr. Deforest Hoyles for possible OSA. States she has trouble staying asleep at night. Denies ever having a SS before.     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  Loud snoring has been noted by family members and this is been worse in the past few months.  When she was visiting [place], she was evaluated by ENT and was advised septoplasty for deviated septum.  She admits to weight gain of 18 pounds over the last 2 years.  She was seen by her PCP, TSH was normal, she was advised pulmonary evaluation.  Epworth sleepiness score is 3 and she denies excessive daytime somnolence and is able to get through her workday but she does admit to increased fatigue lately. Bedtime is between 11 PM to midnight, sleep latency is minimal, she sleeps on her back with 2 pillows, reports 2-3 nocturnal awakenings but denies nocturia and is out of bed by 7:30 AM with dryness of mouth but denies headaches There is no history suggestive of cataplexy, sleep paralysis or parasomnias  She reports tonsillectomy as a child.  She is single and lives by herself, no bed partner history is available.  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 Her younger sister was diagnosed with ovarian cancer and she is undergone CT screening for this      Past Medical History:  Diagnosis Date  . Family history of bone cancer   . Family history of ovarian cancer     No Known Allergies  Social History   Socioeconomic History  . Marital status: Single    Spouse name: Not on file  . Number of children: Not on file  . Years of education: Not on file  . Highest education level: Not on file  Occupational History  . Not on file  Social Needs  . Financial resource strain: Not on file  . Food  insecurity:    Worry: Not on file    Inability: Not on file  . Transportation needs:    Medical: Not on file    Non-medical: Not on file  Tobacco Use  . Smoking status: Never Smoker  . Smokeless tobacco: Never Used  Substance and Sexual Activity  . Alcohol use: No  . Drug use: No  . Sexual activity: Not on file  Lifestyle  . Physical activity:    Days per week: Not on file    Minutes per session: Not on file  . Stress: Not on file  Relationships  . Social connections:    Talks on phone: Not on file    Gets together: Not on file    Attends religious service: Not on file    Active member of club or organization: Not on file    Attends meetings of clubs or organizations: Not on file    Relationship status: Not on file  . Intimate partner violence:    Fear of current or ex partner: Not on file    Emotionally abused: Not on file    Physically abused: Not on file    Forced sexual activity: Not on file  Other Topics Concern  . Not on file  Social History Narrative  . Not on file      Family History  Problem  Relation Age of Onset  . Ovarian cancer Sister 17       Negative genetic testing, [facility] Common Hereditary Cancers Panel  . Bone cancer Maternal Grandfather 71     Review of Systems  Constitutional: Negative for fever and unexpected weight change.  HENT: Negative for congestion, dental problem, ear pain, nosebleeds, postnasal drip, rhinorrhea, sinus pressure, sneezing, sore throat and trouble swallowing.   Eyes: Negative for redness and itching.  Respiratory: Positive for shortness of breath. Negative for cough, chest tightness and wheezing.   Cardiovascular: Negative for palpitations and leg swelling.  Gastrointestinal: Negative for nausea and vomiting.  Genitourinary: Negative for dysuria.  Musculoskeletal: Negative for joint swelling.  Skin: Negative for rash.  Allergic/Immunologic: Negative.  Negative for environmental allergies, food allergies and  immunocompromised state.  Neurological: Negative for headaches.  Hematological: Does not bruise/bleed easily.  Psychiatric/Behavioral: Negative for dysphoric mood. The patient is not nervous/anxious.        Objective:   Physical Exam  Gen. Pleasant,  in no distress, normal affect ENT - no pallor,icterus, no post nasal drip, class 2 airway, cannot visualize posterior pharyngeal wall, mild underbite Neck: No JVD, no thyromegaly, no carotid bruits Lungs: no use of accessory muscles, no dullness to percussion, decreased without rales or rhonchi  Cardiovascular: Rhythm regular, heart sounds  normal, no murmurs or gallops, no peripheral edema Abdomen: soft and non-tender, no hepatosplenomegaly, BS normal. Musculoskeletal: No deformities, no cyanosis or clubbing Neuro:  alert, non focal, no tremors       Assessment & Plan:

## 2018-06-05 DIAGNOSIS — G4733 Obstructive sleep apnea (adult) (pediatric): Secondary | ICD-10-CM

## 2018-06-09 DIAGNOSIS — G4733 Obstructive sleep apnea (adult) (pediatric): Secondary | ICD-10-CM | POA: Diagnosis not present

## 2018-06-10 ENCOUNTER — Telehealth: Payer: Self-pay | Admitting: Pulmonary Disease

## 2018-06-10 NOTE — Telephone Encounter (Signed)
Per RA, HST showed moderate OSA with 19 events per hour. Increases when sleeping in the supine position. Although dental device can be used but he recommends cpap trial first. If agreeable, auto cpap 5-12cm with nasal pillows. OV in 6wks.

## 2018-06-13 NOTE — Telephone Encounter (Signed)
Spoke with patient, she will call back this afternoon to get scheduled to see RA. She wishes to discuss treatment options.

## 2018-06-19 ENCOUNTER — Other Ambulatory Visit: Payer: Self-pay | Admitting: *Deleted

## 2018-06-19 DIAGNOSIS — R0683 Snoring: Secondary | ICD-10-CM

## 2018-07-02 DIAGNOSIS — Z1389 Encounter for screening for other disorder: Secondary | ICD-10-CM | POA: Diagnosis not present

## 2018-07-02 DIAGNOSIS — Z0000 Encounter for general adult medical examination without abnormal findings: Secondary | ICD-10-CM | POA: Diagnosis not present

## 2018-07-02 DIAGNOSIS — Z13220 Encounter for screening for lipoid disorders: Secondary | ICD-10-CM | POA: Diagnosis not present

## 2018-07-02 DIAGNOSIS — N200 Calculus of kidney: Secondary | ICD-10-CM | POA: Diagnosis not present

## 2018-07-02 DIAGNOSIS — Z131 Encounter for screening for diabetes mellitus: Secondary | ICD-10-CM | POA: Diagnosis not present

## 2018-07-07 ENCOUNTER — Encounter: Payer: Self-pay | Admitting: Pulmonary Disease

## 2018-07-07 ENCOUNTER — Ambulatory Visit (INDEPENDENT_AMBULATORY_CARE_PROVIDER_SITE_OTHER): Payer: BLUE CROSS/BLUE SHIELD | Admitting: Pulmonary Disease

## 2018-07-07 VITALS — BP 122/68 | HR 97 | Ht 65.0 in | Wt 186.6 lb

## 2018-07-07 DIAGNOSIS — G4733 Obstructive sleep apnea (adult) (pediatric): Secondary | ICD-10-CM | POA: Diagnosis not present

## 2018-07-07 NOTE — Assessment & Plan Note (Signed)
We discussed treatment options including CPAP and oral appliance.  I would recommend proceeding with auto CPAP 5 to 10 cm, nasal pillows as proof of concept.  This would enable [place] to see whether adequate treatment of OSA causes improvement in her daytime energy levels and alertness If she is comfortable with CPAP then we can consider this as a long-term option with a long-term goal of weight loss and seeing if degree of sleep disordered breathing improves. On the other hand if she is unable to get comfortable with CPAP then we can proceed with oral appliance  We arrived at this plan after long discussion and she was agreeable to proceed Greater than 50% timex 47mins   was spent in counseling and coordination of care with the patient

## 2018-07-07 NOTE — Progress Notes (Signed)
   Subjective:    Patient ID: Desiree Duncan, female    DOB: 1980-08-16, 38 y.o.   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 She was evaluated for snoring, 30 pound weight gain and non-refreshing sleep. We discussed results of sleep study today-this showed moderate OSA, average 19/hour AHI mostly in the supine position although even in the right lateral position this was about 12/hour with mild desaturations    Significant tests/ events reviewed HST 05/2018  moderate OSA with 19 events per hour.Worse in the supine position.  Review of Systems Patient denies significant dyspnea,cough, hemoptysis,  chest pain, palpitations, pedal edema, orthopnea, paroxysmal nocturnal dyspnea, lightheadedness, nausea, vomiting, abdominal or  leg pains      Objective:   Physical Exam  Gen. Pleasant, well-nourished, in no distress ENT - no thrush, no pallor/icterus,no post nasal drip, normal bite, class II airway Neck: No JVD, no thyromegaly, no carotid bruits Lungs: no use of accessory muscles, no dullness to percussion, clear without rales or rhonchi  Cardiovascular: Rhythm regular, heart sounds  normal, no murmurs or gallops, no peripheral edema Musculoskeletal: No deformities, no cyanosis or clubbing         Assessment & Plan:

## 2018-07-07 NOTE — Patient Instructions (Signed)
Prescription for auto CPAP 5 to 10 cm with nasal pillows, for her Will be sent to DME

## 2018-08-12 ENCOUNTER — Ambulatory Visit: Payer: BLUE CROSS/BLUE SHIELD | Admitting: Pulmonary Disease

## 2018-12-04 DIAGNOSIS — D259 Leiomyoma of uterus, unspecified: Secondary | ICD-10-CM | POA: Diagnosis not present

## 2019-01-22 ENCOUNTER — Other Ambulatory Visit: Payer: Self-pay | Admitting: Internal Medicine

## 2019-01-22 DIAGNOSIS — D1803 Hemangioma of intra-abdominal structures: Secondary | ICD-10-CM

## 2019-01-31 DIAGNOSIS — Z20828 Contact with and (suspected) exposure to other viral communicable diseases: Secondary | ICD-10-CM | POA: Diagnosis not present

## 2019-02-07 DIAGNOSIS — Z20828 Contact with and (suspected) exposure to other viral communicable diseases: Secondary | ICD-10-CM | POA: Diagnosis not present

## 2019-02-10 ENCOUNTER — Other Ambulatory Visit: Payer: BLUE CROSS/BLUE SHIELD

## 2019-03-03 ENCOUNTER — Other Ambulatory Visit: Payer: BLUE CROSS/BLUE SHIELD
